# Patient Record
Sex: Female | Born: 1960 | Race: Asian | Hispanic: No | Marital: Married
Health system: Southern US, Community
[De-identification: ages and names within clinical notes are randomized; demographics above are authoritative.]

---

## 1999-01-06 ENCOUNTER — Other Ambulatory Visit: Admission: RE | Admit: 1999-01-06 | Discharge: 1999-01-06 | Payer: Self-pay | Admitting: Cardiology

## 2000-05-12 ENCOUNTER — Other Ambulatory Visit: Admission: RE | Admit: 2000-05-12 | Discharge: 2000-05-12 | Payer: Self-pay | Admitting: Family Medicine

## 2001-05-31 ENCOUNTER — Other Ambulatory Visit: Admission: RE | Admit: 2001-05-31 | Discharge: 2001-05-31 | Payer: Self-pay | Admitting: *Deleted

## 2002-06-06 ENCOUNTER — Other Ambulatory Visit: Admission: RE | Admit: 2002-06-06 | Discharge: 2002-06-06 | Payer: Self-pay | Admitting: Family Medicine

## 2003-06-09 ENCOUNTER — Other Ambulatory Visit: Admission: RE | Admit: 2003-06-09 | Discharge: 2003-06-09 | Payer: Self-pay | Admitting: Family Medicine

## 2004-06-16 ENCOUNTER — Other Ambulatory Visit: Admission: RE | Admit: 2004-06-16 | Discharge: 2004-06-16 | Payer: Self-pay | Admitting: Family Medicine

## 2004-08-07 ENCOUNTER — Emergency Department (HOSPITAL_COMMUNITY): Admission: EM | Admit: 2004-08-07 | Discharge: 2004-08-07 | Payer: Self-pay | Admitting: *Deleted

## 2006-06-26 ENCOUNTER — Other Ambulatory Visit: Admission: RE | Admit: 2006-06-26 | Discharge: 2006-06-26 | Payer: Self-pay | Admitting: Family Medicine

## 2007-08-07 ENCOUNTER — Other Ambulatory Visit: Admission: RE | Admit: 2007-08-07 | Discharge: 2007-08-07 | Payer: Self-pay | Admitting: Family Medicine

## 2007-09-05 ENCOUNTER — Encounter: Admission: RE | Admit: 2007-09-05 | Discharge: 2007-09-05 | Payer: Self-pay | Admitting: Gastroenterology

## 2008-05-29 ENCOUNTER — Other Ambulatory Visit: Admission: RE | Admit: 2008-05-29 | Discharge: 2008-05-29 | Payer: Self-pay | Admitting: Family Medicine

## 2009-08-12 ENCOUNTER — Other Ambulatory Visit: Admission: RE | Admit: 2009-08-12 | Discharge: 2009-08-12 | Payer: Self-pay | Admitting: Family Medicine

## 2009-09-01 ENCOUNTER — Encounter: Admission: RE | Admit: 2009-09-01 | Discharge: 2009-09-01 | Payer: Self-pay | Admitting: Gastroenterology

## 2010-09-05 ENCOUNTER — Encounter: Payer: Self-pay | Admitting: Gastroenterology

## 2010-09-07 ENCOUNTER — Other Ambulatory Visit
Admission: RE | Admit: 2010-09-07 | Discharge: 2010-09-07 | Payer: Self-pay | Source: Home / Self Care | Admitting: Family Medicine

## 2010-09-07 ENCOUNTER — Other Ambulatory Visit: Payer: Self-pay | Admitting: Family Medicine

## 2010-09-21 ENCOUNTER — Other Ambulatory Visit: Payer: Self-pay | Admitting: Gastroenterology

## 2010-09-21 DIAGNOSIS — B181 Chronic viral hepatitis B without delta-agent: Secondary | ICD-10-CM

## 2010-09-27 ENCOUNTER — Ambulatory Visit
Admission: RE | Admit: 2010-09-27 | Discharge: 2010-09-27 | Disposition: A | Discharge status: Home / Self Care | Payer: 59 | Source: Ambulatory Visit | Attending: Gastroenterology | Admitting: Gastroenterology

## 2010-09-27 DIAGNOSIS — B181 Chronic viral hepatitis B without delta-agent: Secondary | ICD-10-CM

## 2010-11-01 ENCOUNTER — Other Ambulatory Visit: Payer: Self-pay | Admitting: Gastroenterology

## 2011-09-19 ENCOUNTER — Other Ambulatory Visit: Payer: Self-pay | Admitting: Family Medicine

## 2011-09-19 ENCOUNTER — Other Ambulatory Visit (HOSPITAL_COMMUNITY)
Admission: RE | Admit: 2011-09-19 | Discharge: 2011-09-19 | Disposition: A | Discharge status: Home / Self Care | Payer: 59 | Source: Ambulatory Visit | Attending: Family Medicine | Admitting: Family Medicine

## 2011-09-19 DIAGNOSIS — Z124 Encounter for screening for malignant neoplasm of cervix: Secondary | ICD-10-CM | POA: Insufficient documentation

## 2011-09-19 DIAGNOSIS — R87810 Cervical high risk human papillomavirus (HPV) DNA test positive: Secondary | ICD-10-CM | POA: Insufficient documentation

## 2012-09-24 ENCOUNTER — Other Ambulatory Visit (HOSPITAL_COMMUNITY)
Admission: RE | Admit: 2012-09-24 | Discharge: 2012-09-24 | Disposition: A | Discharge status: Home / Self Care | Payer: 59 | Source: Ambulatory Visit | Attending: Family Medicine | Admitting: Family Medicine

## 2012-09-24 ENCOUNTER — Other Ambulatory Visit: Payer: Self-pay | Admitting: Family Medicine

## 2012-09-24 DIAGNOSIS — Z1151 Encounter for screening for human papillomavirus (HPV): Secondary | ICD-10-CM | POA: Insufficient documentation

## 2012-09-24 DIAGNOSIS — Z124 Encounter for screening for malignant neoplasm of cervix: Secondary | ICD-10-CM | POA: Insufficient documentation

## 2013-03-19 ENCOUNTER — Other Ambulatory Visit: Payer: Self-pay | Admitting: Family Medicine

## 2013-03-19 ENCOUNTER — Ambulatory Visit
Admission: RE | Admit: 2013-03-19 | Discharge: 2013-03-19 | Disposition: A | Discharge status: Home / Self Care | Payer: Worker's Compensation | Source: Ambulatory Visit | Attending: Family Medicine | Admitting: Family Medicine

## 2013-03-19 DIAGNOSIS — S99912A Unspecified injury of left ankle, initial encounter: Secondary | ICD-10-CM

## 2013-04-09 ENCOUNTER — Ambulatory Visit: Payer: PRIVATE HEALTH INSURANCE | Attending: Family Medicine | Admitting: Rehabilitation

## 2013-04-09 DIAGNOSIS — M25579 Pain in unspecified ankle and joints of unspecified foot: Secondary | ICD-10-CM | POA: Insufficient documentation

## 2013-04-09 DIAGNOSIS — IMO0001 Reserved for inherently not codable concepts without codable children: Secondary | ICD-10-CM | POA: Insufficient documentation

## 2013-04-18 ENCOUNTER — Ambulatory Visit: Payer: PRIVATE HEALTH INSURANCE | Attending: Family Medicine | Admitting: Rehabilitation

## 2013-04-18 DIAGNOSIS — IMO0001 Reserved for inherently not codable concepts without codable children: Secondary | ICD-10-CM | POA: Insufficient documentation

## 2013-04-18 DIAGNOSIS — M25579 Pain in unspecified ankle and joints of unspecified foot: Secondary | ICD-10-CM | POA: Insufficient documentation

## 2013-04-19 ENCOUNTER — Encounter: Payer: Self-pay | Admitting: Rehabilitation

## 2013-04-22 ENCOUNTER — Encounter: Payer: Self-pay | Admitting: Rehabilitation

## 2013-05-08 ENCOUNTER — Ambulatory Visit: Payer: PRIVATE HEALTH INSURANCE | Attending: Family Medicine | Admitting: Rehabilitation

## 2013-05-08 DIAGNOSIS — IMO0001 Reserved for inherently not codable concepts without codable children: Secondary | ICD-10-CM | POA: Insufficient documentation

## 2013-05-08 DIAGNOSIS — M25579 Pain in unspecified ankle and joints of unspecified foot: Secondary | ICD-10-CM | POA: Insufficient documentation

## 2013-11-25 ENCOUNTER — Other Ambulatory Visit: Payer: Self-pay | Admitting: Gastroenterology

## 2013-11-25 DIAGNOSIS — B181 Chronic viral hepatitis B without delta-agent: Secondary | ICD-10-CM

## 2013-12-03 ENCOUNTER — Ambulatory Visit
Admission: RE | Admit: 2013-12-03 | Discharge: 2013-12-03 | Disposition: A | Discharge status: Home / Self Care | Payer: 59 | Source: Ambulatory Visit | Attending: Gastroenterology | Admitting: Gastroenterology

## 2013-12-03 DIAGNOSIS — B181 Chronic viral hepatitis B without delta-agent: Secondary | ICD-10-CM

## 2015-10-26 IMAGING — US US ABDOMEN LIMITED
1 series · 14 of 25 positions shown · non-contrast
Comparison: Ultrasound abdomen of 09/27/2010

CLINICAL DATA: Hepatitis-B carrier, evaluate for hepatoma

EXAM:
US ABDOMEN LIMITED - RIGHT UPPER QUADRANT

[Series 1: us abdomen limited · 0.26mm/px · 14 of 53 slices shown]
[im 1/53]
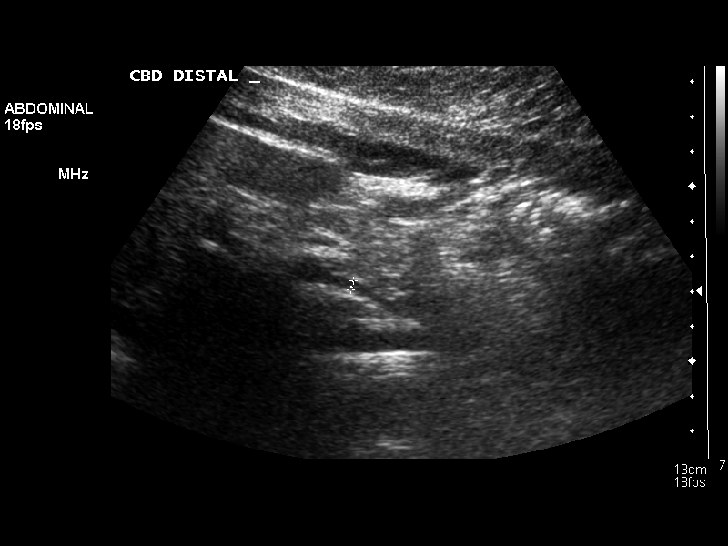
[im 5/53]
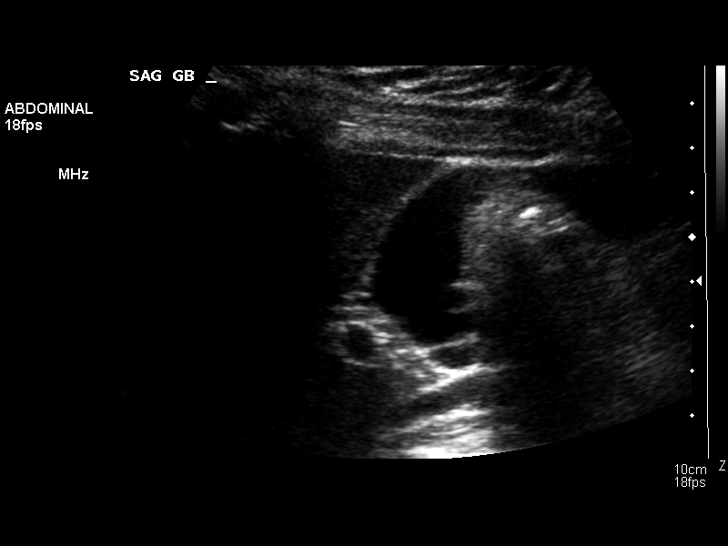
[im 9/53]
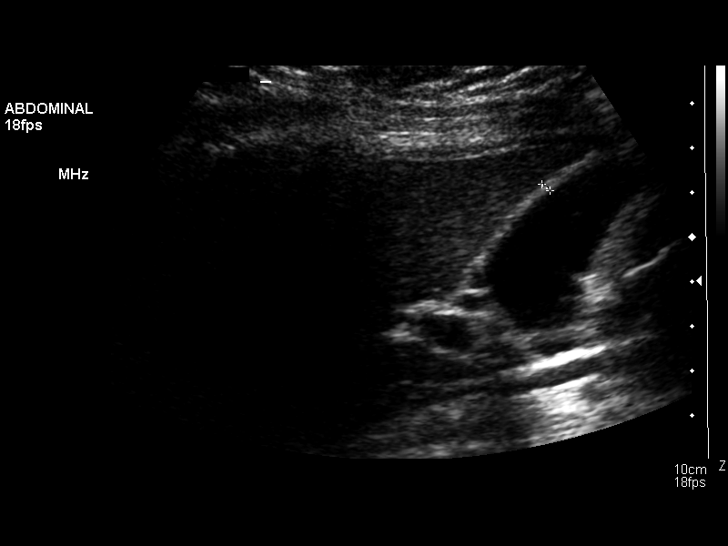
[im 14/53]
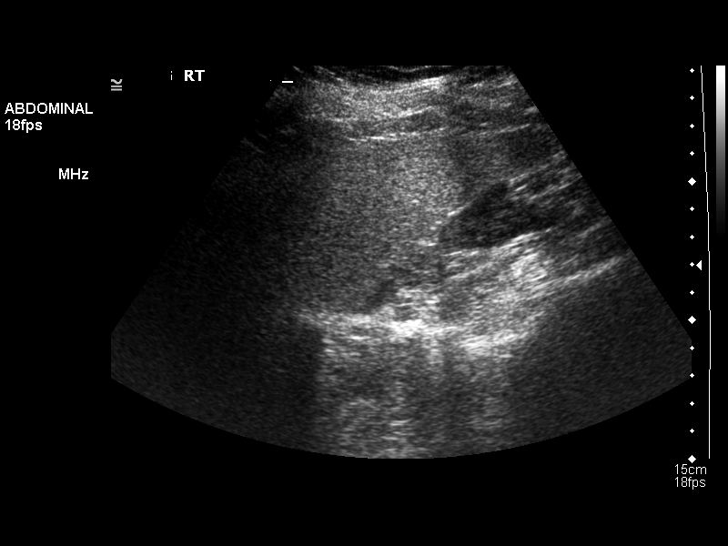
[im 18/53]
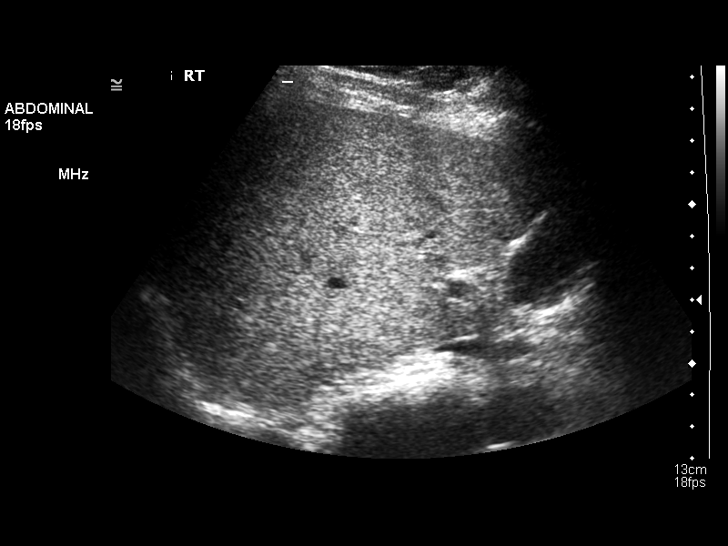
[im 20/53]
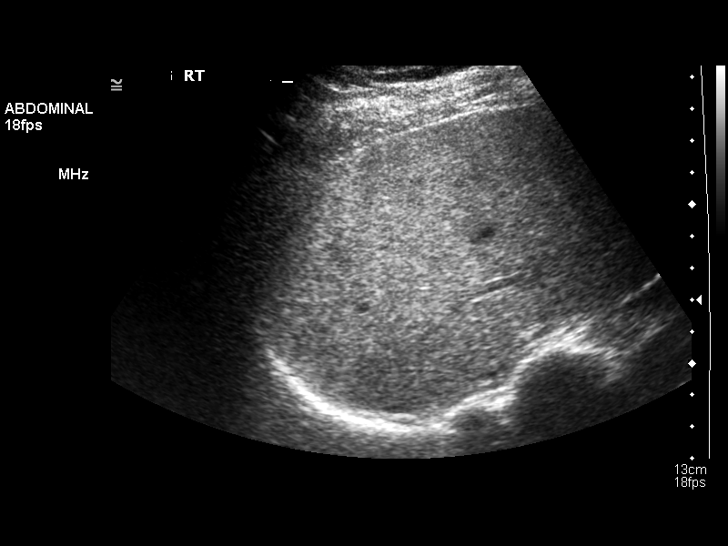
[im 24/53]
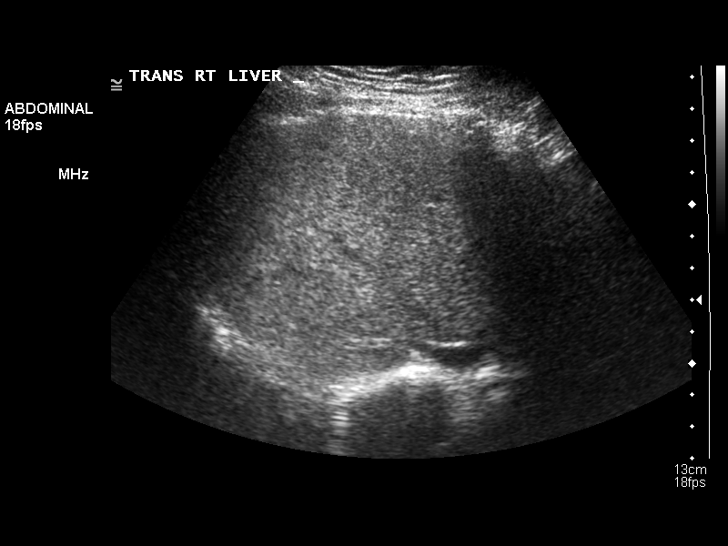
[im 29/53]
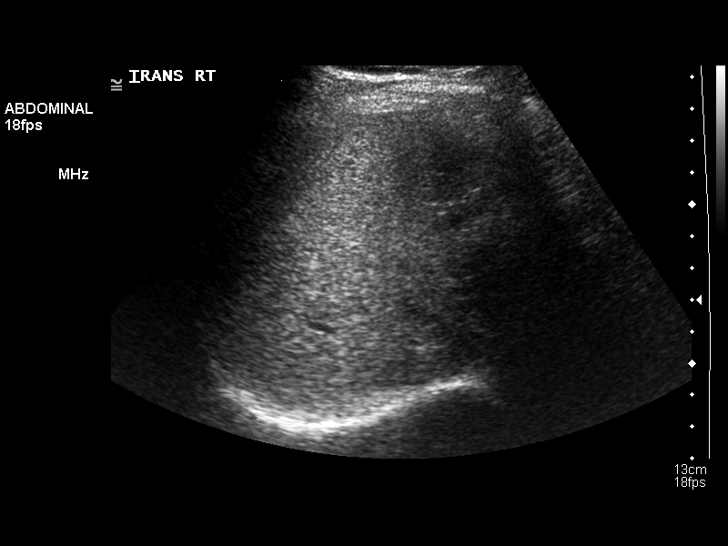
[im 33/53]
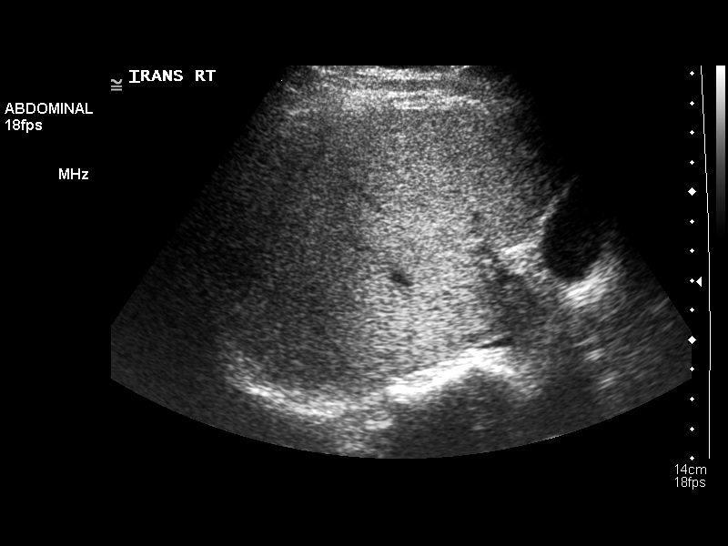
[im 35/53]
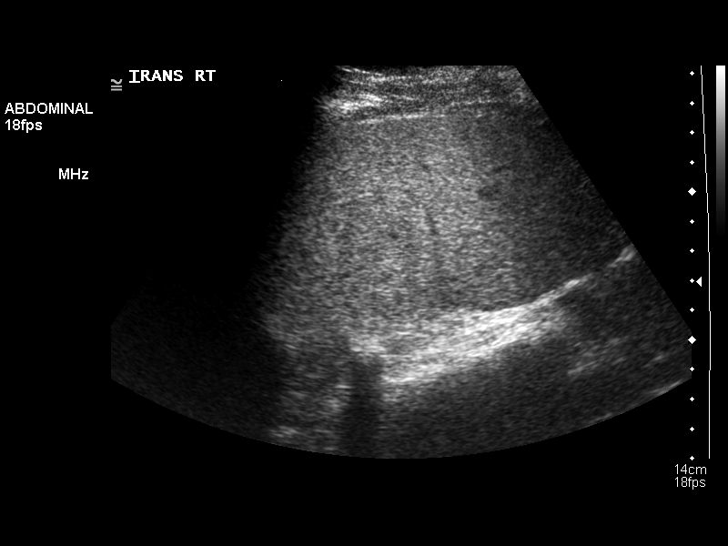
[im 40/53]
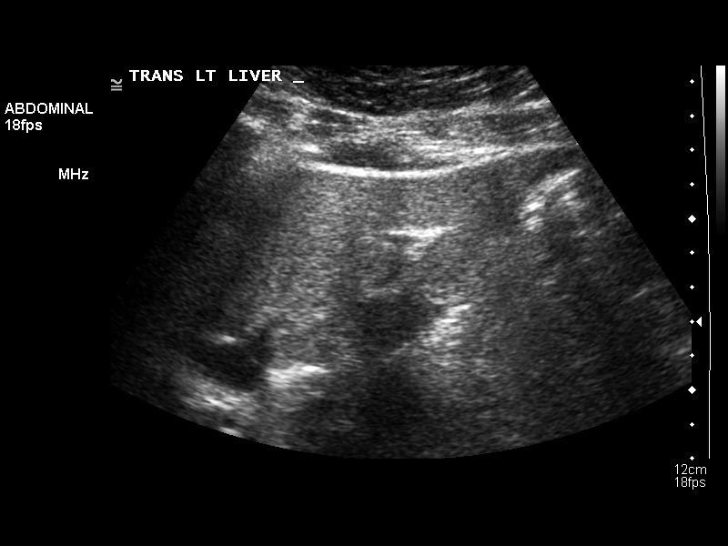
[im 44/53]
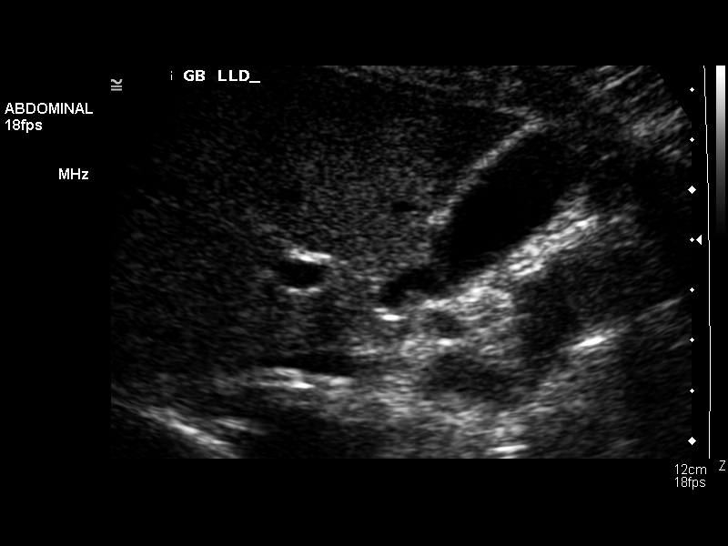
[im 48/53]
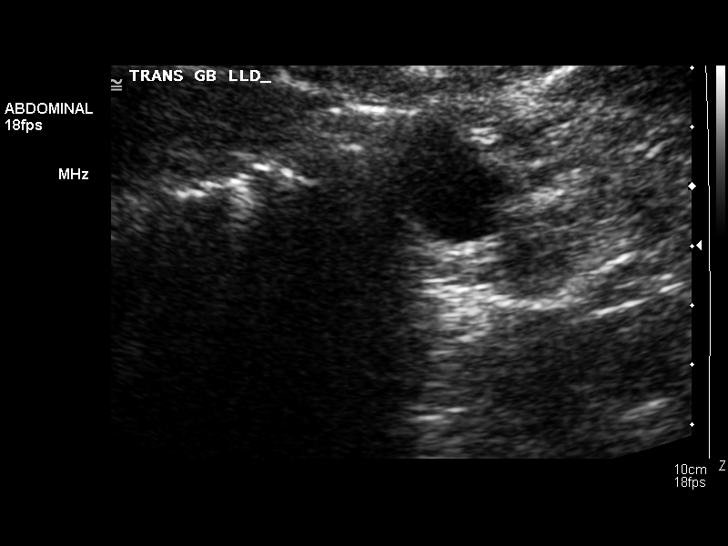
[im 53/53]
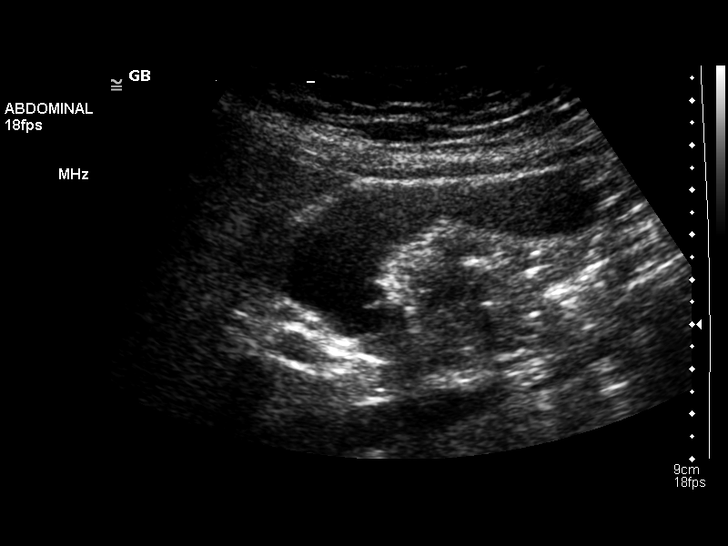

[14 of 25 positions shown; findings below may reference images not displayed]

FINDINGS: Gallbladder:

The gallbladder is visualized and no gallstones are noted. There is
no pain over the gallbladder with compression.

Common bile duct:

Diameter: The common bile duct is normal measuring 2.7 mm in
diameter.

Liver:

The liver is slightly inhomogeneous but no focal hepatic lesion is
seen. These findings may indicate mild fatty infiltration. No ductal
dilatation is noted.
IMPRESSION: 1. No focal hepatic lesion.
2. Somewhat inhomogeneous liver parenchyma may indicate fatty
infiltration.
3. No gallstones.

## 2015-12-28 ENCOUNTER — Other Ambulatory Visit: Payer: Self-pay | Admitting: Family Medicine

## 2015-12-28 ENCOUNTER — Other Ambulatory Visit (HOSPITAL_COMMUNITY)
Admission: RE | Admit: 2015-12-28 | Discharge: 2015-12-28 | Disposition: A | Discharge status: Home / Self Care | Payer: 59 | Source: Ambulatory Visit | Attending: Family Medicine | Admitting: Family Medicine

## 2015-12-28 DIAGNOSIS — Z124 Encounter for screening for malignant neoplasm of cervix: Secondary | ICD-10-CM | POA: Insufficient documentation

## 2015-12-28 DIAGNOSIS — Z1151 Encounter for screening for human papillomavirus (HPV): Secondary | ICD-10-CM | POA: Diagnosis not present

## 2015-12-29 LAB — CYTOLOGY - PAP

## 2019-01-16 ENCOUNTER — Other Ambulatory Visit: Payer: Self-pay | Admitting: Family Medicine

## 2019-01-16 ENCOUNTER — Other Ambulatory Visit (HOSPITAL_COMMUNITY)
Admission: RE | Admit: 2019-01-16 | Discharge: 2019-01-16 | Disposition: A | Discharge status: Home / Self Care | Payer: BLUE CROSS/BLUE SHIELD | Source: Ambulatory Visit | Attending: Family Medicine | Admitting: Family Medicine

## 2019-01-16 DIAGNOSIS — Z124 Encounter for screening for malignant neoplasm of cervix: Secondary | ICD-10-CM | POA: Insufficient documentation

## 2019-01-21 LAB — CYTOLOGY - PAP
Diagnosis: NEGATIVE
HPV: NOT DETECTED
